# Patient Record
Sex: Male | Born: 1965 | Race: White | Hispanic: No | Marital: Married | Smoking: Current every day smoker
Health system: Southern US, Community
[De-identification: ages and names within clinical notes are randomized; demographics above are authoritative.]

## PROBLEM LIST (undated history)

## (undated) DIAGNOSIS — M1990 Unspecified osteoarthritis, unspecified site: Secondary | ICD-10-CM

## (undated) DIAGNOSIS — F419 Anxiety disorder, unspecified: Secondary | ICD-10-CM

## (undated) DIAGNOSIS — G5600 Carpal tunnel syndrome, unspecified upper limb: Secondary | ICD-10-CM

## (undated) DIAGNOSIS — G4700 Insomnia, unspecified: Secondary | ICD-10-CM

## (undated) DIAGNOSIS — S0292XA Unspecified fracture of facial bones, initial encounter for closed fracture: Secondary | ICD-10-CM

## (undated) HISTORY — PX: OTHER SURGICAL HISTORY: SHX169

## (undated) HISTORY — DX: Anxiety disorder, unspecified: F41.9

## (undated) HISTORY — DX: Unspecified osteoarthritis, unspecified site: M19.90

## (undated) HISTORY — DX: Unspecified fracture of facial bones, initial encounter for closed fracture: S02.92XA

## (undated) HISTORY — DX: Insomnia, unspecified: G47.00

## (undated) HISTORY — DX: Carpal tunnel syndrome, unspecified upper limb: G56.00

---

## 2006-04-03 ENCOUNTER — Emergency Department (HOSPITAL_COMMUNITY): Admission: EM | Admit: 2006-04-03 | Discharge: 2006-04-03 | Payer: Self-pay | Admitting: Emergency Medicine

## 2006-05-13 ENCOUNTER — Emergency Department (HOSPITAL_COMMUNITY): Admission: EM | Admit: 2006-05-13 | Discharge: 2006-05-13 | Payer: Self-pay | Admitting: Emergency Medicine

## 2010-04-12 ENCOUNTER — Encounter: Payer: Self-pay | Admitting: Family Medicine

## 2011-12-07 DIAGNOSIS — J45901 Unspecified asthma with (acute) exacerbation: Secondary | ICD-10-CM | POA: Insufficient documentation

## 2012-02-09 ENCOUNTER — Encounter: Payer: Self-pay | Admitting: *Deleted

## 2012-02-09 ENCOUNTER — Ambulatory Visit (INDEPENDENT_AMBULATORY_CARE_PROVIDER_SITE_OTHER): Payer: Commercial Indemnity | Admitting: Cardiology

## 2012-02-09 ENCOUNTER — Other Ambulatory Visit: Payer: Self-pay | Admitting: Cardiology

## 2012-02-09 ENCOUNTER — Telehealth: Payer: Self-pay | Admitting: Cardiology

## 2012-02-09 ENCOUNTER — Encounter: Payer: Self-pay | Admitting: Cardiology

## 2012-02-09 VITALS — BP 128/83 | HR 88 | Ht 67.0 in | Wt 141.0 lb

## 2012-02-09 DIAGNOSIS — Z720 Tobacco use: Secondary | ICD-10-CM | POA: Insufficient documentation

## 2012-02-09 DIAGNOSIS — M7700 Medial epicondylitis, unspecified elbow: Secondary | ICD-10-CM | POA: Insufficient documentation

## 2012-02-09 DIAGNOSIS — F411 Generalized anxiety disorder: Secondary | ICD-10-CM | POA: Insufficient documentation

## 2012-02-09 DIAGNOSIS — G4700 Insomnia, unspecified: Secondary | ICD-10-CM | POA: Insufficient documentation

## 2012-02-09 DIAGNOSIS — R072 Precordial pain: Secondary | ICD-10-CM | POA: Insufficient documentation

## 2012-02-09 DIAGNOSIS — F17200 Nicotine dependence, unspecified, uncomplicated: Secondary | ICD-10-CM

## 2012-02-09 NOTE — Telephone Encounter (Signed)
stress echocardiogram  Scheduled for 02-14-1012 [facility]
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
## 2012-02-09 NOTE — Assessment & Plan Note (Signed)
We discussed smoking cessation today. He is trying to cut back at the encouragement of his wife.

## 2012-02-09 NOTE — Telephone Encounter (Signed)
Pt has [employer].  No precert required for stress echo.

## 2012-02-09 NOTE — Assessment & Plan Note (Signed)
Intermittent, long-standing as outlined, largely atypical in description. His resting ECG shows no acute ST segment abnormalities. Also having recent trouble with anxiety and insomnia. Main cardiac risk factors include gender and long-standing tobacco use. Plan is to evaluate with an exercise echocardiogram for ischemic assessment. We will inform him of the results.

## 2012-02-09 NOTE — Patient Instructions (Addendum)
Your physician has requested that you have a stress echocardiogram. For further information please visit www.[email or web address]. Please follow instruction sheet as given. Your physician recommends that you continue on your current medications as directed. Please refer to the Current Medication list given to you today. We will call you with your results. 

## 2012-02-14 DIAGNOSIS — R079 Chest pain, unspecified: Secondary | ICD-10-CM

## 2012-02-15 ENCOUNTER — Telehealth: Payer: Self-pay | Admitting: *Deleted

## 2012-02-15 NOTE — Telephone Encounter (Signed)
Patient informed via voicemail.

## 2012-02-15 NOTE — Telephone Encounter (Signed)
Message copied by Eustace Moore on Tue Feb 15, 2012  1:18 PM ------      Message from: Jonelle Sidle      Created: Tue Feb 15, 2012  9:14 AM       Reviewed. Please let him know that study did not indicate any clear evidence of obstructive CAD. Overall reassuring results. Continue to work on smoking cessation, management of anxiety and insomnia per Dr. Neita Carp. We can certainly see him back if symptoms worsen.

## 2012-04-03 DIAGNOSIS — R300 Dysuria: Secondary | ICD-10-CM | POA: Insufficient documentation

## 2012-10-16 DIAGNOSIS — L049 Acute lymphadenitis, unspecified: Secondary | ICD-10-CM | POA: Insufficient documentation

## 2012-12-12 DIAGNOSIS — R197 Diarrhea, unspecified: Secondary | ICD-10-CM | POA: Insufficient documentation

## 2013-01-16 DIAGNOSIS — R059 Cough, unspecified: Secondary | ICD-10-CM | POA: Insufficient documentation

## 2013-01-16 DIAGNOSIS — J019 Acute sinusitis, unspecified: Secondary | ICD-10-CM | POA: Insufficient documentation

## 2013-01-16 DIAGNOSIS — R05 Cough: Secondary | ICD-10-CM | POA: Insufficient documentation

## 2013-04-19 DIAGNOSIS — M2550 Pain in unspecified joint: Secondary | ICD-10-CM | POA: Insufficient documentation

## 2014-08-06 DIAGNOSIS — M6530 Trigger finger, unspecified finger: Secondary | ICD-10-CM | POA: Insufficient documentation

## 2014-08-13 DIAGNOSIS — G5603 Carpal tunnel syndrome, bilateral upper limbs: Secondary | ICD-10-CM | POA: Insufficient documentation

## 2015-03-12 ENCOUNTER — Encounter: Payer: Self-pay | Admitting: *Deleted

## 2015-03-18 ENCOUNTER — Encounter: Payer: Self-pay | Admitting: Diagnostic Neuroimaging

## 2015-03-18 ENCOUNTER — Ambulatory Visit (INDEPENDENT_AMBULATORY_CARE_PROVIDER_SITE_OTHER): Payer: 59 | Admitting: Diagnostic Neuroimaging

## 2015-03-18 VITALS — BP 113/74 | HR 63 | Ht 70.5 in | Wt 130.8 lb

## 2015-03-18 DIAGNOSIS — R202 Paresthesia of skin: Secondary | ICD-10-CM | POA: Diagnosis not present

## 2015-03-18 DIAGNOSIS — M62541 Muscle wasting and atrophy, not elsewhere classified, right hand: Secondary | ICD-10-CM | POA: Diagnosis not present

## 2015-03-18 DIAGNOSIS — M79641 Pain in right hand: Secondary | ICD-10-CM

## 2015-03-18 DIAGNOSIS — M542 Cervicalgia: Secondary | ICD-10-CM

## 2015-03-18 DIAGNOSIS — R200 Anesthesia of skin: Secondary | ICD-10-CM
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
## 2015-03-18 NOTE — Patient Instructions (Signed)
Thank you for coming to see [place] at [facility]. I hope we have been able to provide you high quality care today.  You may receive a patient satisfaction survey over the next few weeks. We would appreciate your feedback and comments so that we may continue to improve ourselves and the health of our patients.  - consider rheumatology evaluation - avoid pressure on right elbow/wrist - may consider MRI cervical spine and EMG (nerve test) in future   ~~~~~~~~~~~~~~~~~~~~~~~~~~~~~~~~~~~~~~~~~~~~~~~~~~~~~~~~~~~~~~~~~  DR. PENUMALLI'S GUIDE TO HAPPY AND HEALTHY LIVING These are some of my general health and wellness recommendations. Some of them may apply to you better than others. Please use common sense as you try these suggestions and feel free to ask me any questions.   ACTIVITY/FITNESS Mental, social, emotional and physical stimulation are very important for brain and body health. Try learning a new activity (arts, music, language, sports, games).  Keep moving your body to the best of your abilities. You can do this at home, inside or outside, the park, community center, gym or anywhere you like. Consider a physical therapist or personal trainer to get started. Consider the app Sworkit. Fitness trackers such as smart-watches, smart-phones or Fitbits can help as well.   NUTRITION Eat more plants: colorful vegetables, nuts, seeds and berries.  Eat less sugar, salt, preservatives and processed foods.  Avoid toxins such as cigarettes and alcohol.  Drink water when you are thirsty. Warm water with a slice of lemon is an excellent morning drink to start the day.  Consider these websites for more information The Nutrition Source (https://www.henry-hernandez.biz/) Precision Nutrition (WindowBlog.ch)   RELAXATION Consider practicing mindfulness meditation or other relaxation techniques such as deep breathing, prayer, yoga, tai chi,  massage. See website mindful.org or the apps Headspace or Calm to help get started.   SLEEP Try to get at least 7-8+ hours sleep per day. Regular exercise and reduced caffeine will help you sleep better. Practice good sleep hygeine techniques. See website sleep.org for more information.   PLANNING Prepare estate planning, living will, healthcare POA documents. Sometimes this is best planned with the help of an attorney. Theconversationproject.org and agingwithdignity.org are excellent resources.

## 2015-06-20 ENCOUNTER — Ambulatory Visit (INDEPENDENT_AMBULATORY_CARE_PROVIDER_SITE_OTHER): Payer: 59 | Admitting: Diagnostic Neuroimaging

## 2015-06-20 ENCOUNTER — Encounter: Payer: Self-pay | Admitting: Diagnostic Neuroimaging

## 2015-06-20 VITALS — BP 106/69 | HR 71 | Wt 126.0 lb

## 2015-06-20 DIAGNOSIS — M542 Cervicalgia: Secondary | ICD-10-CM

## 2015-06-20 DIAGNOSIS — M62541 Muscle wasting and atrophy, not elsewhere classified, right hand: Secondary | ICD-10-CM

## 2015-06-20 DIAGNOSIS — M79641 Pain in right hand: Secondary | ICD-10-CM

## 2015-06-20 DIAGNOSIS — R202 Paresthesia of skin: Secondary | ICD-10-CM

## 2015-06-20 DIAGNOSIS — R200 Anesthesia of skin: Secondary | ICD-10-CM
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
## 2015-08-08 DIAGNOSIS — J209 Acute bronchitis, unspecified: Secondary | ICD-10-CM | POA: Insufficient documentation

## 2015-09-17 ENCOUNTER — Other Ambulatory Visit (HOSPITAL_COMMUNITY): Payer: Self-pay | Admitting: Internal Medicine

## 2015-09-17 DIAGNOSIS — B182 Chronic viral hepatitis C: Secondary | ICD-10-CM

## 2015-09-25 ENCOUNTER — Ambulatory Visit (HOSPITAL_COMMUNITY): Payer: 59

## 2015-10-02 ENCOUNTER — Ambulatory Visit (HOSPITAL_COMMUNITY)
Admission: RE | Admit: 2015-10-02 | Discharge: 2015-10-02 | Disposition: A | Discharge status: Home / Self Care | Payer: Managed Care, Other (non HMO) | Source: Ambulatory Visit | Attending: Internal Medicine | Admitting: Internal Medicine

## 2015-10-02 DIAGNOSIS — K824 Cholesterolosis of gallbladder: Secondary | ICD-10-CM | POA: Diagnosis not present

## 2015-10-02 DIAGNOSIS — B182 Chronic viral hepatitis C: Secondary | ICD-10-CM

## 2015-11-12 DIAGNOSIS — B182 Chronic viral hepatitis C: Secondary | ICD-10-CM | POA: Insufficient documentation

## 2015-12-24 ENCOUNTER — Ambulatory Visit: Payer: 59 | Admitting: Diagnostic Neuroimaging

## 2016-05-26 DIAGNOSIS — Z681 Body mass index (BMI) 19 or less, adult: Secondary | ICD-10-CM | POA: Insufficient documentation

## 2016-11-17 ENCOUNTER — Encounter (INDEPENDENT_AMBULATORY_CARE_PROVIDER_SITE_OTHER): Payer: Self-pay

## 2016-11-17 ENCOUNTER — Encounter (INDEPENDENT_AMBULATORY_CARE_PROVIDER_SITE_OTHER): Payer: Self-pay | Admitting: Internal Medicine

## 2016-12-21 ENCOUNTER — Ambulatory Visit (INDEPENDENT_AMBULATORY_CARE_PROVIDER_SITE_OTHER): Payer: 59 | Admitting: Internal Medicine

## 2017-01-20 ENCOUNTER — Ambulatory Visit (INDEPENDENT_AMBULATORY_CARE_PROVIDER_SITE_OTHER): Payer: 59 | Admitting: Internal Medicine

## 2017-01-24 NOTE — Progress Notes (Signed)
err

## 2017-01-27 ENCOUNTER — Encounter (INDEPENDENT_AMBULATORY_CARE_PROVIDER_SITE_OTHER): Payer: Self-pay | Admitting: Internal Medicine

## 2017-09-06 ENCOUNTER — Other Ambulatory Visit (HOSPITAL_COMMUNITY): Payer: Self-pay | Admitting: Gastroenterology

## 2017-09-06 DIAGNOSIS — B1920 Unspecified viral hepatitis C without hepatic coma: Secondary | ICD-10-CM

## 2017-09-13 ENCOUNTER — Ambulatory Visit (HOSPITAL_COMMUNITY)
Admission: RE | Admit: 2017-09-13 | Discharge: 2017-09-13 | Disposition: A | Discharge status: Home / Self Care | Payer: Managed Care, Other (non HMO) | Source: Ambulatory Visit | Attending: Gastroenterology | Admitting: Gastroenterology

## 2017-09-13 DIAGNOSIS — B1920 Unspecified viral hepatitis C without hepatic coma: Secondary | ICD-10-CM | POA: Insufficient documentation

## 2017-09-15 ENCOUNTER — Other Ambulatory Visit (HOSPITAL_COMMUNITY): Payer: Self-pay | Admitting: Pulmonary Disease

## 2017-09-15 DIAGNOSIS — R918 Other nonspecific abnormal finding of lung field: Secondary | ICD-10-CM

## 2017-09-26 ENCOUNTER — Ambulatory Visit (HOSPITAL_COMMUNITY)
Admission: RE | Admit: 2017-09-26 | Discharge: 2017-09-26 | Disposition: A | Discharge status: Home / Self Care | Payer: Managed Care, Other (non HMO) | Source: Ambulatory Visit | Attending: Pulmonary Disease | Admitting: Pulmonary Disease

## 2017-09-26 DIAGNOSIS — R918 Other nonspecific abnormal finding of lung field: Secondary | ICD-10-CM

## 2017-09-26 DIAGNOSIS — R911 Solitary pulmonary nodule: Secondary | ICD-10-CM | POA: Diagnosis not present

## 2017-09-26 DIAGNOSIS — M899 Disorder of bone, unspecified: Secondary | ICD-10-CM | POA: Insufficient documentation

## 2017-09-26 MED ORDER — FLUDEOXYGLUCOSE F - 18 (FDG) INJECTION
8.5600 | Freq: Once | INTRAVENOUS | Status: AC | PRN
  Administered 2017-09-26: 8.56 via INTRAVENOUS

## 2017-10-03 ENCOUNTER — Telehealth: Payer: Self-pay | Admitting: *Deleted

## 2017-10-03 NOTE — Telephone Encounter (Signed)
Oncology Nurse Navigator Documentation  Oncology Nurse Navigator Flowsheets 10/03/2017  Navigator Location [facility]  Referral date to RadOnc/MedOnc 10/03/2017  Navigator Encounter Type Telephone/I received referral from Dr. Kathaleen Grinder office today.  I called patient to schedule for [place] this week. I was unable to reach him but did leave a vm message with my name and phone number to call.   Telephone Outgoing Call  Treatment Phase Abnormal Scans  Barriers/Navigation Needs Coordination of Care  Interventions Coordination of Care  Coordination of Care Other  Acuity Level 2  Time Spent with Patient 30

## 2017-10-04 ENCOUNTER — Telehealth: Payer: Self-pay | Admitting: *Deleted

## 2017-10-04 NOTE — Telephone Encounter (Signed)
Oncology Nurse Navigator Documentation  Oncology Nurse Navigator Flowsheets 10/04/2017  Navigator Location [facility]  Navigator Encounter Type Telephone/I called Zachary Mejia today but was unable to reach him. I did leave vm message for him to call me with my name and phone number.   Telephone Outgoing Call  Treatment Phase Abnormal Scans  Barriers/Navigation Needs Coordination of Care  Interventions Coordination of Care  Coordination of Care Other  Acuity Level 1  Time Spent with Patient 15

## 2017-10-24 ENCOUNTER — Encounter: Payer: Self-pay | Admitting: *Deleted

## 2017-10-24 NOTE — Progress Notes (Signed)
Oncology Nurse Navigator Documentation  Oncology Nurse Navigator Flowsheets 10/24/2017  Navigator Location [facility]  Navigator Encounter Type Telephone/Patient called me and left a message to call his wife with an appt time.  I called and spoke with her. I gave her the appt for [place] on 815/19.  He verbalized understanding of appt time and place.   Telephone Incoming Call;Outgoing Call  Treatment Phase Abnormal Scans  Barriers/Navigation Needs Education;Coordination of Care  Education Other  Interventions Coordination of Care;Education  Coordination of Care Appts  Education Method Verbal  Acuity Level 2  Time Spent with Patient 15

## 2017-11-01 ENCOUNTER — Encounter: Payer: Self-pay | Admitting: Internal Medicine

## 2017-11-01 NOTE — Progress Notes (Signed)
Called Dr. [name] office at [phone] to request patient's medical records for  upcoming thoracic conference/clinic, left voicemail with Marletta Lor, also sent a faxed request to the office at [phone], still waiting for records.

## 2017-11-02 ENCOUNTER — Telehealth: Payer: Self-pay | Admitting: Internal Medicine

## 2017-11-02 ENCOUNTER — Other Ambulatory Visit: Payer: Self-pay | Admitting: *Deleted

## 2017-11-02 ENCOUNTER — Encounter: Payer: Self-pay | Admitting: *Deleted

## 2017-11-02 DIAGNOSIS — R072 Precordial pain: Secondary | ICD-10-CM

## 2017-11-02 NOTE — Progress Notes (Signed)
Oncology Nurse Navigator Documentation  Oncology Nurse Navigator Flowsheets 11/02/2017  Navigator Location [facility]  Navigator Encounter Type Other/I called Dr. Kathaleen Grinder office to obtain records. He was seen only once by this office.   Treatment Phase Abnormal Scans  Barriers/Navigation Needs Coordination of Care  Interventions Coordination of Care  Coordination of Care Other  Acuity Level 2  Time Spent with Patient 30

## 2017-11-02 NOTE — Telephone Encounter (Signed)
Spoke with patients wife [name] to confirm 8/15 [place] appointment, she confirmed, I also asked her to bring CD of the CT Chest with her tomorrow as well she said she would do her best to at least get the report if nothing else

## 2017-11-03 ENCOUNTER — Inpatient Hospital Stay: Payer: Managed Care, Other (non HMO) | Attending: Internal Medicine

## 2017-11-03 ENCOUNTER — Encounter: Payer: Self-pay | Admitting: *Deleted

## 2017-11-03 ENCOUNTER — Ambulatory Visit (INDEPENDENT_AMBULATORY_CARE_PROVIDER_SITE_OTHER): Payer: Managed Care, Other (non HMO) | Admitting: Thoracic Surgery (Cardiothoracic Vascular Surgery)

## 2017-11-03 ENCOUNTER — Encounter: Payer: Self-pay | Admitting: Internal Medicine

## 2017-11-03 ENCOUNTER — Inpatient Hospital Stay (HOSPITAL_BASED_OUTPATIENT_CLINIC_OR_DEPARTMENT_OTHER): Payer: Managed Care, Other (non HMO) | Admitting: Internal Medicine

## 2017-11-03 ENCOUNTER — Ambulatory Visit
Admission: RE | Admit: 2017-11-03 | Discharge: 2017-11-03 | Disposition: A | Discharge status: Home / Self Care | Payer: Managed Care, Other (non HMO) | Source: Ambulatory Visit | Attending: Radiation Oncology | Admitting: Radiation Oncology

## 2017-11-03 ENCOUNTER — Other Ambulatory Visit: Payer: Self-pay | Admitting: Medical Oncology

## 2017-11-03 VITALS — BP 108/70 | HR 73 | Temp 97.8°F | Resp 18 | Ht 70.5 in | Wt 132.2 lb

## 2017-11-03 DIAGNOSIS — M129 Arthropathy, unspecified: Secondary | ICD-10-CM | POA: Diagnosis not present

## 2017-11-03 DIAGNOSIS — Z79899 Other long term (current) drug therapy: Secondary | ICD-10-CM | POA: Diagnosis not present

## 2017-11-03 DIAGNOSIS — F419 Anxiety disorder, unspecified: Secondary | ICD-10-CM | POA: Diagnosis not present

## 2017-11-03 DIAGNOSIS — Z720 Tobacco use: Secondary | ICD-10-CM

## 2017-11-03 DIAGNOSIS — R918 Other nonspecific abnormal finding of lung field: Secondary | ICD-10-CM

## 2017-11-03 DIAGNOSIS — F17210 Nicotine dependence, cigarettes, uncomplicated: Secondary | ICD-10-CM

## 2017-11-03 DIAGNOSIS — R072 Precordial pain: Secondary | ICD-10-CM

## 2017-11-03 DIAGNOSIS — R05 Cough: Secondary | ICD-10-CM | POA: Diagnosis not present

## 2017-11-03 DIAGNOSIS — R911 Solitary pulmonary nodule: Secondary | ICD-10-CM | POA: Diagnosis not present

## 2017-11-03 LAB — CBC WITH DIFFERENTIAL (CANCER CENTER ONLY)
Basophils Absolute: 0 10*3/uL (ref 0.0–0.1)
Basophils Relative: 0 %
EOS PCT: 1 %
Eosinophils Absolute: 0.1 10*3/uL (ref 0.0–0.5)
HCT: 40.7 % (ref 38.4–49.9)
Hemoglobin: 13.3 g/dL (ref 13.0–17.1)
LYMPHS ABS: 1.9 10*3/uL (ref 0.9–3.3)
LYMPHS PCT: 25 %
MCH: 28.9 pg (ref 27.2–33.4)
MCHC: 32.7 g/dL (ref 32.0–36.0)
MCV: 88.5 fL (ref 79.3–98.0)
MONO ABS: 0.6 10*3/uL (ref 0.1–0.9)
Monocytes Relative: 7 %
Neutro Abs: 5 10*3/uL (ref 1.5–6.5)
Neutrophils Relative %: 67 %
Platelet Count: 163 10*3/uL (ref 140–400)
RBC: 4.6 MIL/uL (ref 4.20–5.82)
RDW: 14.8 % — AB (ref 11.0–14.6)
WBC Count: 7.6 10*3/uL (ref 4.0–10.3)

## 2017-11-03 LAB — CMP (CANCER CENTER ONLY)
ALBUMIN: 3.9 g/dL (ref 3.5–5.0)
ALK PHOS: 101 U/L (ref 38–126)
ALT: 63 U/L — AB (ref 0–44)
AST: 41 U/L (ref 15–41)
Anion gap: 7 (ref 5–15)
BILIRUBIN TOTAL: 0.4 mg/dL (ref 0.3–1.2)
BUN: 13 mg/dL (ref 6–20)
CO2: 30 mmol/L (ref 22–32)
CREATININE: 1.05 mg/dL (ref 0.61–1.24)
Calcium: 9.2 mg/dL (ref 8.9–10.3)
Chloride: 103 mmol/L (ref 98–111)
GFR, Est AFR Am: >60 mL/min (ref >60)
GFR, Estimated: >60 mL/min (ref >60)
GLUCOSE: 83 mg/dL (ref 70–99)
Potassium: 4.4 mmol/L (ref 3.5–5.1)
Sodium: 140 mmol/L (ref 135–145)
TOTAL PROTEIN: 7.1 g/dL (ref 6.5–8.1)
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
## 2017-11-03 NOTE — Progress Notes (Signed)
[place] Clinical Social Work  Clinical Social Work met with patient/family at [employer] appointment to offer support and assess for psychosocial needs. Mr. Knueppel shared he has no concerns and is very relieved to learn he does not have cancer.      Clinical Social Work briefly discussed Clinical Social Work role and [employer] support programs/services.  Clinical Social Work encouraged patient to call with any additional questions or concerns.   Maryjean Morn, MSW, LCSW, OSW-C Clinical Social Worker [facility] [phone]

## 2017-11-03 NOTE — Progress Notes (Signed)
Oncology Nurse Navigator Documentation  Oncology Nurse Navigator Flowsheets 11/03/2017  Navigator Location [facility]  Navigator Encounter Type Clinic/MDC/I spoke with patient at thoracic clinic today.  I help to explain next steps. He will have a follow up with Dr. Roxan Hockey with a scan and patient verbalized understanding.   Abnormal Finding Date 08/23/2017  Multidisiplinary Clinic Date 11/03/2017  Patient Visit Type MedOnc  Treatment Phase Abnormal Scans  Barriers/Navigation Needs Education  Education Other  Interventions Education  Education Method Verbal  Acuity Level 2  Time Spent with Patient 30
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
## 2017-11-03 NOTE — Progress Notes (Signed)
Spoke with Kim @ [facility] in [place] [phone]) to get a copy of patients CT Chest on a CD, Maudie Mercury advised she will have to mail it to me she can not push images over

## 2017-11-04 ENCOUNTER — Telehealth: Payer: Self-pay | Admitting: Internal Medicine

## 2017-11-04 NOTE — Telephone Encounter (Signed)
Per 8/15 los f/u as needed. No other orders.

## 2017-11-09 ENCOUNTER — Ambulatory Visit
Admission: RE | Admit: 2017-11-09 | Discharge: 2017-11-09 | Disposition: A | Discharge status: Home / Self Care | Payer: Self-pay | Source: Ambulatory Visit | Attending: Radiation Oncology | Admitting: Radiation Oncology

## 2017-11-09 ENCOUNTER — Other Ambulatory Visit: Payer: Self-pay | Admitting: Radiation Oncology

## 2017-11-09 DIAGNOSIS — C3490 Malignant neoplasm of unspecified part of unspecified bronchus or lung: Secondary | ICD-10-CM

## 2017-11-25 ENCOUNTER — Telehealth: Payer: Self-pay | Admitting: *Deleted

## 2017-11-25 NOTE — Telephone Encounter (Signed)
Oncology Nurse Navigator Documentation  Oncology Nurse Navigator Flowsheets 11/25/2017  Navigator Location [facility]  Navigator Encounter Type Telephone/I called to remind about his appt time and location with Dr. Roxan Hockey on 11/28/17 and to check on his smoking cessation.  I was unable to reach him but did speak to his wife.  I updated her on time and location for appt with Dr. Roxan Hockey.  I asked how his smoking cessation is going.  I listened as she explained he has cut back but not quit.  I told her he can call me and we can go over tips to quit. She was thankful for the call and update.   Telephone Outgoing Call  Abnormal Finding Date 08/23/2017  Multidisiplinary Clinic Date 11/03/2017  Barriers/Navigation Needs Education  Education Smoking cessation;Other  Interventions Education  Education Method Verbal  Acuity Level 1  Time Spent with Patient 30

## 2017-11-28 ENCOUNTER — Ambulatory Visit: Payer: Self-pay | Admitting: Thoracic Surgery (Cardiothoracic Vascular Surgery)

## 2017-11-28 ENCOUNTER — Other Ambulatory Visit: Payer: Self-pay | Admitting: *Deleted

## 2017-11-28 VITALS — BP 113/72 | HR 76 | Resp 20 | Ht 70.5 in | Wt 131.0 lb

## 2017-11-28 DIAGNOSIS — R911 Solitary pulmonary nodule: Secondary | ICD-10-CM

## 2017-11-28 NOTE — Progress Notes (Signed)
Zachary Mejia returns today for follow-up of a right middle lobe lung abnormality seen on CT back in June and on PET/CT in July.  He was supposed of had a CT of the chest done.  Unfortunately, that was not done and we have no new scan to review.  We will schedule CT chest.  [facility] be done at [facility].  I will see him back next week to discuss results.

## 2017-11-29 ENCOUNTER — Other Ambulatory Visit: Payer: Self-pay | Admitting: Thoracic Surgery (Cardiothoracic Vascular Surgery)

## 2017-12-07 ENCOUNTER — Encounter: Payer: Self-pay | Admitting: Thoracic Surgery (Cardiothoracic Vascular Surgery)

## 2017-12-07 ENCOUNTER — Ambulatory Visit (INDEPENDENT_AMBULATORY_CARE_PROVIDER_SITE_OTHER): Payer: Managed Care, Other (non HMO) | Admitting: Thoracic Surgery (Cardiothoracic Vascular Surgery)

## 2017-12-07 ENCOUNTER — Ambulatory Visit
Admission: RE | Admit: 2017-12-07 | Discharge: 2017-12-07 | Disposition: A | Discharge status: Home / Self Care | Payer: Managed Care, Other (non HMO) | Source: Ambulatory Visit | Attending: Thoracic Surgery (Cardiothoracic Vascular Surgery) | Admitting: Thoracic Surgery (Cardiothoracic Vascular Surgery)

## 2017-12-07 VITALS — BP 103/71 | HR 80 | Resp 20 | Ht 70.5 in | Wt 131.0 lb

## 2017-12-07 DIAGNOSIS — R911 Solitary pulmonary nodule: Secondary | ICD-10-CM
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
## 2018-02-01 ENCOUNTER — Other Ambulatory Visit: Payer: Self-pay | Admitting: *Deleted

## 2018-02-01 DIAGNOSIS — R911 Solitary pulmonary nodule: Secondary | ICD-10-CM

## 2018-02-01 NOTE — Progress Notes (Unsigned)
ct 

## 2018-03-07 ENCOUNTER — Other Ambulatory Visit: Payer: Managed Care, Other (non HMO)

## 2018-03-07 ENCOUNTER — Ambulatory Visit: Payer: Managed Care, Other (non HMO) | Admitting: Thoracic Surgery (Cardiothoracic Vascular Surgery)

## 2018-03-17 ENCOUNTER — Ambulatory Visit
Admission: RE | Admit: 2018-03-17 | Discharge: 2018-03-17 | Disposition: A | Discharge status: Home / Self Care | Payer: Managed Care, Other (non HMO) | Source: Ambulatory Visit | Attending: Thoracic Surgery (Cardiothoracic Vascular Surgery) | Admitting: Thoracic Surgery (Cardiothoracic Vascular Surgery)

## 2018-03-17 DIAGNOSIS — R911 Solitary pulmonary nodule: Secondary | ICD-10-CM

## 2018-04-03 ENCOUNTER — Other Ambulatory Visit: Payer: Managed Care, Other (non HMO)

## 2018-04-04 ENCOUNTER — Ambulatory Visit: Payer: Managed Care, Other (non HMO) | Admitting: Thoracic Surgery (Cardiothoracic Vascular Surgery)

## 2018-04-17 ENCOUNTER — Ambulatory Visit (HOSPITAL_COMMUNITY)
Admission: RE | Admit: 2018-04-17 | Discharge: 2018-04-17 | Disposition: A | Discharge status: Home / Self Care | Payer: Managed Care, Other (non HMO) | Source: Ambulatory Visit | Attending: Otolaryngology | Admitting: Otolaryngology

## 2018-04-17 ENCOUNTER — Other Ambulatory Visit (HOSPITAL_COMMUNITY): Payer: Self-pay | Admitting: Otolaryngology

## 2018-04-17 ENCOUNTER — Ambulatory Visit (HOSPITAL_COMMUNITY): Payer: Managed Care, Other (non HMO)

## 2018-04-17 DIAGNOSIS — L0201 Cutaneous abscess of face: Secondary | ICD-10-CM | POA: Diagnosis present

## 2018-04-17 DIAGNOSIS — L03211 Cellulitis of face: Secondary | ICD-10-CM | POA: Insufficient documentation

## 2018-04-17 MED ORDER — IOHEXOL 300 MG/ML  SOLN
75.0000 mL | Freq: Once | INTRAMUSCULAR | Status: AC | PRN
  Administered 2018-04-17: 75 mL via INTRAVENOUS

## 2018-04-25 ENCOUNTER — Ambulatory Visit: Payer: Managed Care, Other (non HMO) | Admitting: Thoracic Surgery (Cardiothoracic Vascular Surgery)

## 2018-05-01 ENCOUNTER — Ambulatory Visit (INDEPENDENT_AMBULATORY_CARE_PROVIDER_SITE_OTHER): Payer: Managed Care, Other (non HMO) | Admitting: Thoracic Surgery (Cardiothoracic Vascular Surgery)

## 2018-05-01 VITALS — BP 102/69 | HR 70 | Resp 20 | Ht 70.5 in | Wt 139.0 lb

## 2018-05-01 DIAGNOSIS — R911 Solitary pulmonary nodule: Secondary | ICD-10-CM | POA: Diagnosis not present
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
## 2019-01-04 IMAGING — CT CT CHEST W/O CM
1 series · 15 of 34 positions shown, 19 images · non-contrast
Comparison: PET 09/26/2017 and CT chest 08/23/2017.

CLINICAL DATA: Right lung nodule, cough, smoker.

EXAM:
CT CHEST WITHOUT CONTRAST
TECHNIQUE: Multidetector CT imaging of the chest was performed following the
standard protocol without IV contrast.

[Series 2: chest w/(date) · axial · 0.76mm/px · z∈[-337,-7]mm · 15 of 195 slices shown, 19 images]
[im 15/195  mediastinal]
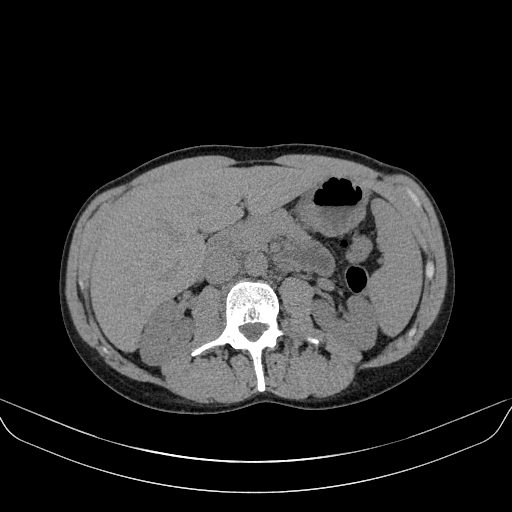
[im 15/195  lung]
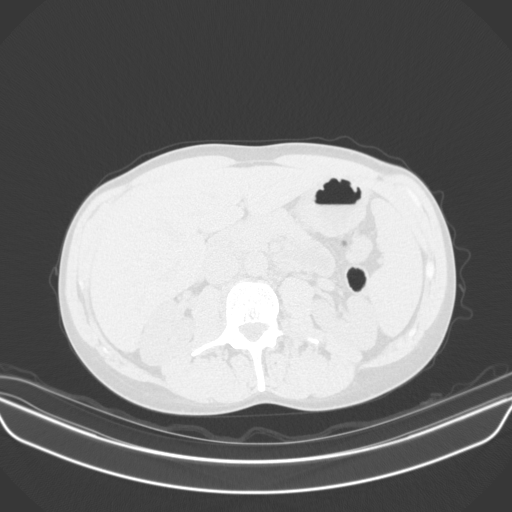
[im 29/195  lung]
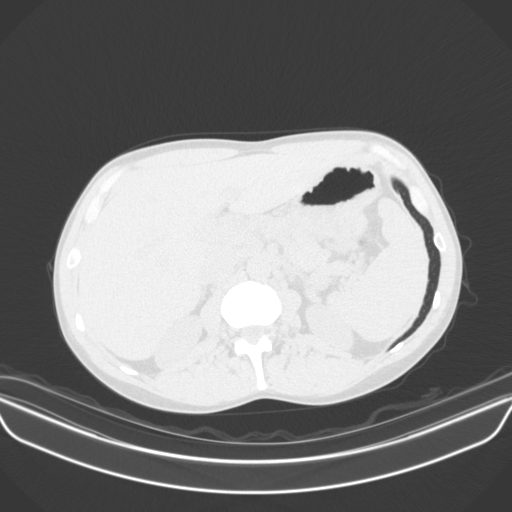
[im 39/195  lung]
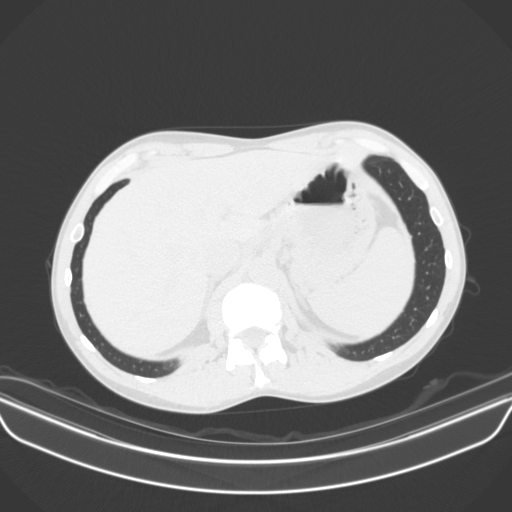
[im 51/195  lung]
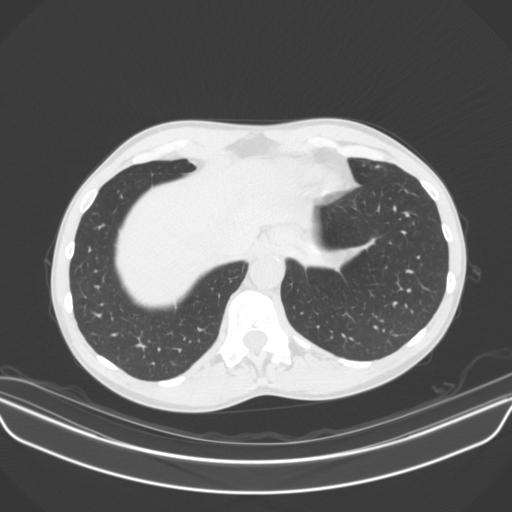
[im 65/195  mediastinal]
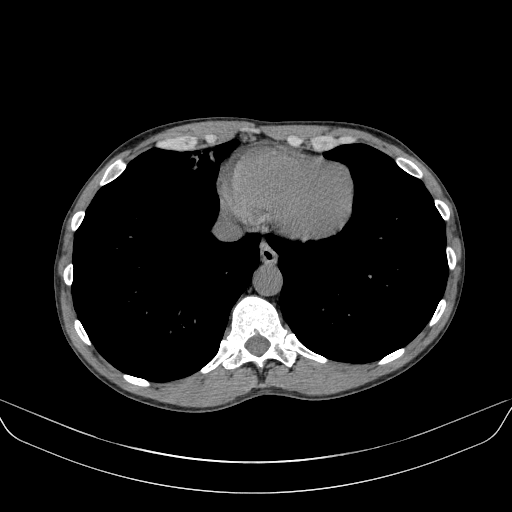
[im 65/195  lung]
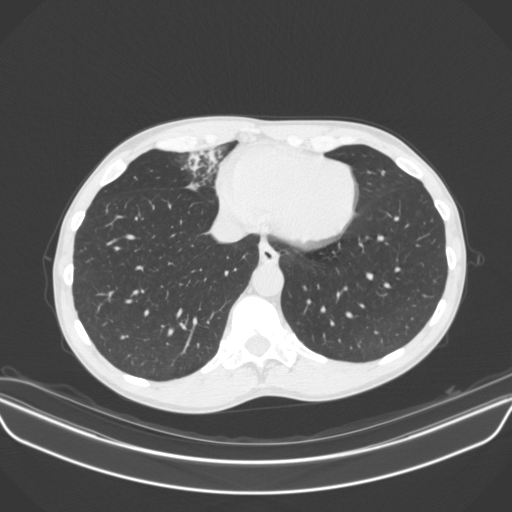
[im 78/195  lung]
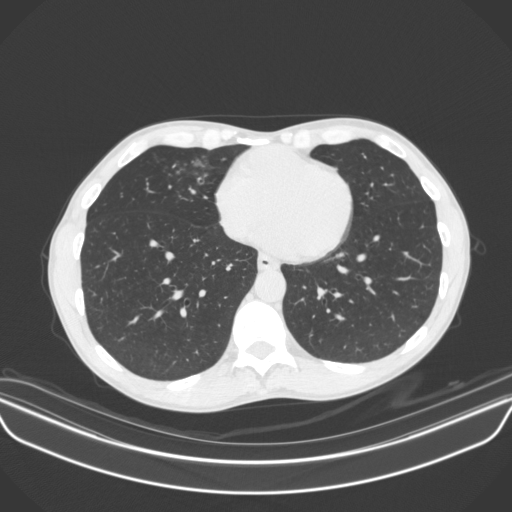
[im 87/195  lung]
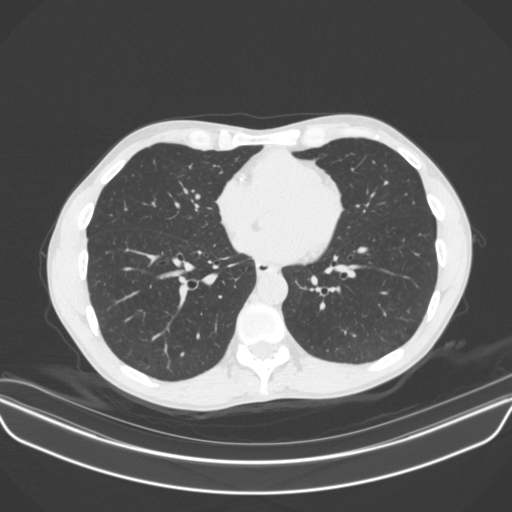
[im 101/195  lung]
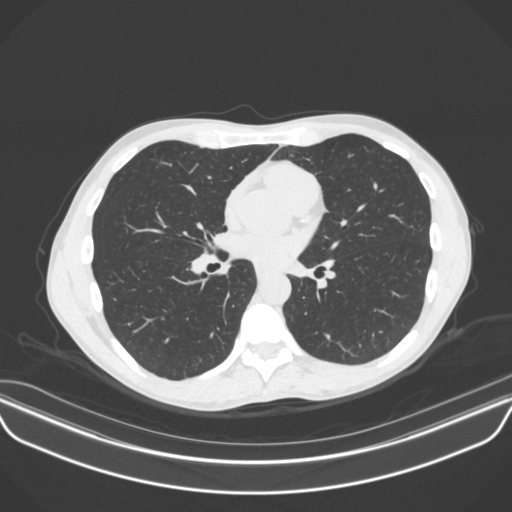
[im 108/195  mediastinal]
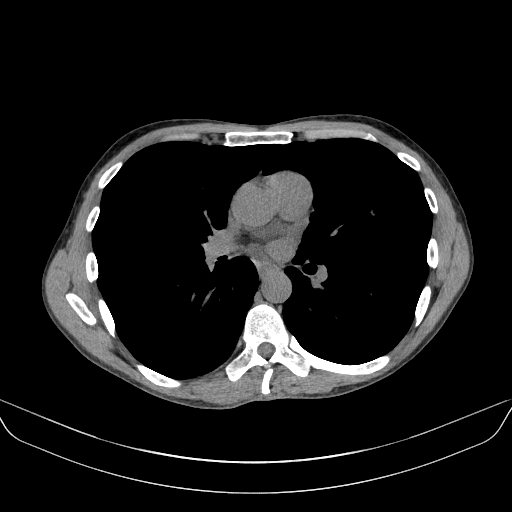
[im 108/195  lung]
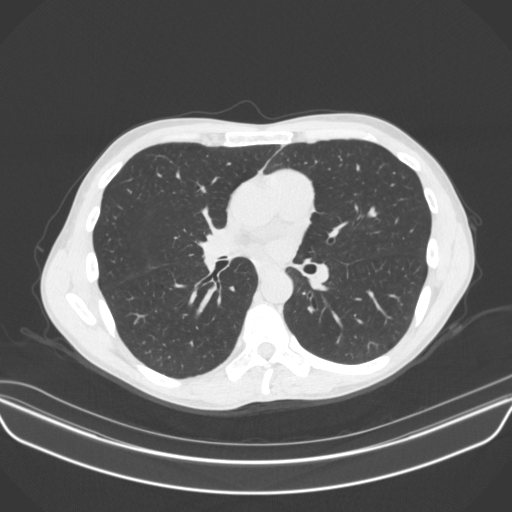
[im 117/195  lung]
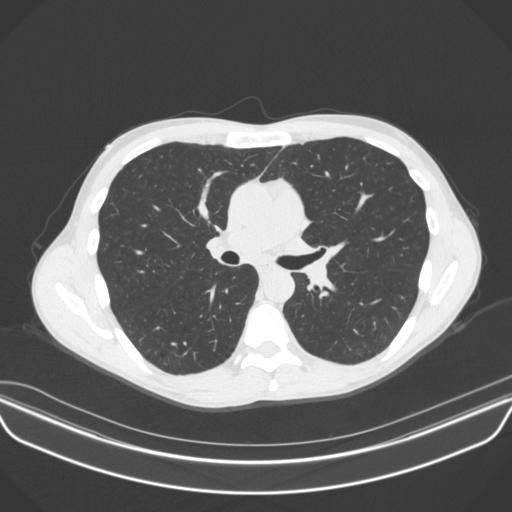
[im 130/195  lung]
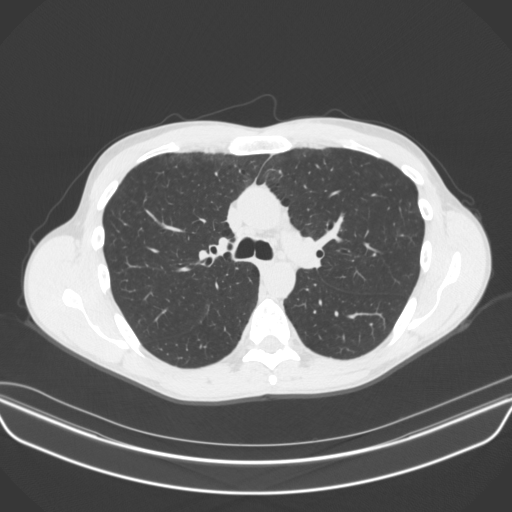
[im 144/195  lung]
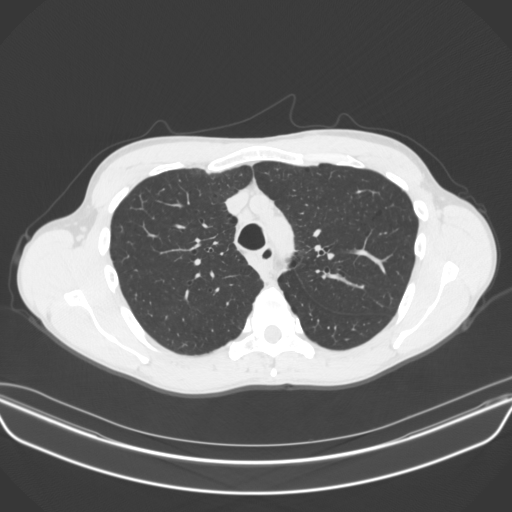
[im 156/195  mediastinal]
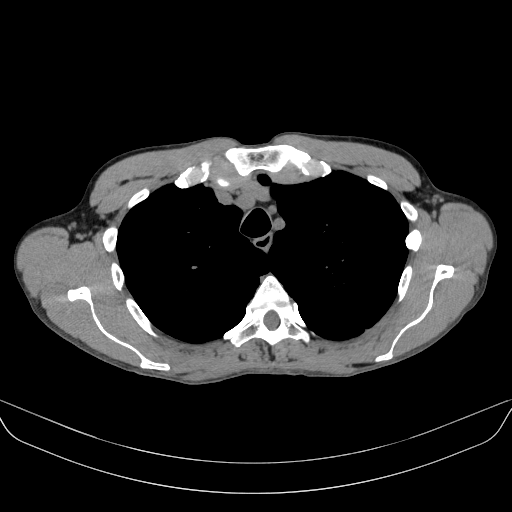
[im 156/195  lung]
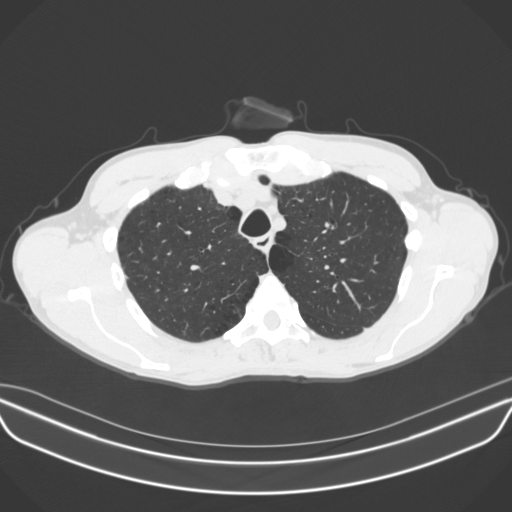
[im 166/195  lung]
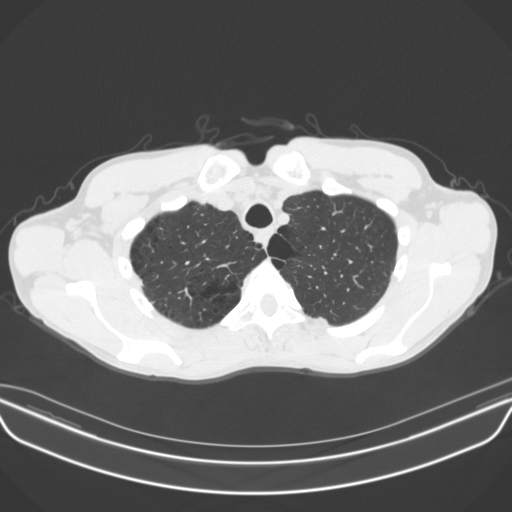
[im 180/195  lung]
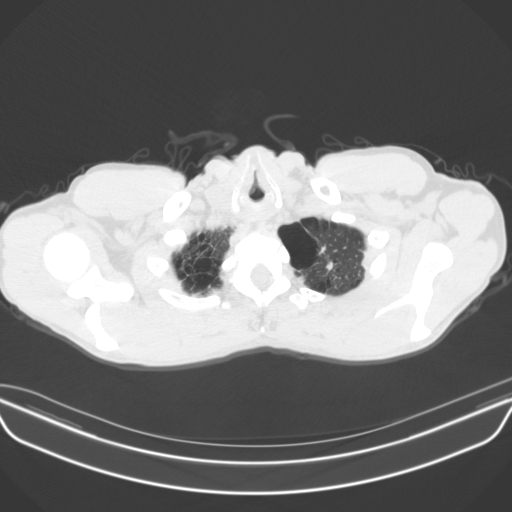

[15 of 34 positions shown; findings below may reference images not displayed]

FINDINGS: Cardiovascular: Coronary artery calcification. Heart size normal. No
pericardial effusion.

Mediastinum/Nodes: Mediastinal lymph nodes measure up to 9 mm in the
right paratracheal station, likely similar. Hilar regions are
difficult to evaluate without IV contrast. No axillary adenopathy.
Esophagus is grossly unremarkable.

Lungs/Pleura: Biapical pleuroparenchymal scarring, left greater than
right. Centrilobular and paraseptal emphysema with bullous changes
the apices. Ill-defined centrilobular nodularity of smoking related
respiratory bronchiolitis. 4 mm anterior segment right upper lobe
nodule (series 3, image 67), unchanged. Somewhat amorphous
peribronchovascular nodularity, bronchiectasis, mucoid impaction and
consolidation in the right middle lobe is again seen. The area of
consolidation appears less nodular than on prior studies. Lungs are
otherwise clear. No pleural fluid. Airway is unremarkable.

Upper Abdomen: Visualized portions of the liver, adrenal glands,
kidneys, spleen, pancreas, stomach and bowel are grossly
unremarkable. Upper abdominal lymph nodes are not enlarged by CT
size criteria.

Musculoskeletal:
IMPRESSION: 1. Somewhat amorphous area of clustered peribronchovascular
nodularity, bronchiectasis, mucoid impaction and consolidation in
the right middle lobe. The area of consolidation appears less
nodular than on the prior exam. An indolent infectious process
and/or post infectious scarring is strongly favored over malignancy.
Additional follow-up CT chest without contrast could be obtained in
3 months, as clinically indicated.
2.  Emphysema ([id number]).

## 2019-04-18 ENCOUNTER — Other Ambulatory Visit: Payer: Self-pay

## 2019-04-18 ENCOUNTER — Encounter (INDEPENDENT_AMBULATORY_CARE_PROVIDER_SITE_OTHER): Payer: Self-pay | Admitting: Gastroenterology

## 2019-04-18 ENCOUNTER — Ambulatory Visit (INDEPENDENT_AMBULATORY_CARE_PROVIDER_SITE_OTHER): Payer: Managed Care, Other (non HMO) | Admitting: Gastroenterology

## 2019-04-18 ENCOUNTER — Encounter (INDEPENDENT_AMBULATORY_CARE_PROVIDER_SITE_OTHER): Payer: Self-pay | Admitting: *Deleted

## 2019-04-18 VITALS — BP 141/88 | HR 80 | Temp 97.4°F | Ht 68.5 in | Wt 136.1 lb

## 2019-04-18 DIAGNOSIS — B1920 Unspecified viral hepatitis C without hepatic coma: Secondary | ICD-10-CM
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
## 2019-04-18 NOTE — Patient Instructions (Signed)
We are checking additional labs today for evaluation and will contact you with results.  We will also need to arrange an ultrasound to determine if there is fibrosis in the liver before treating the virus  Please let me know if you like to schedule colonoscopy.

## 2019-04-21 LAB — CBC WITH DIFFERENTIAL/PLATELET
Absolute Monocytes: 535 cells/uL (ref 200–950)
Basophils Absolute: 50 cells/uL (ref 0–200)
Basophils Relative: 0.5 %
Eosinophils Absolute: 89 cells/uL (ref 15–500)
Eosinophils Relative: 0.9 %
HCT: 46.6 % (ref 38.5–50.0)
Hemoglobin: 15.9 g/dL (ref 13.2–17.1)
Lymphs Abs: 1861 cells/uL (ref 850–3900)
MCH: 30 pg (ref 27.0–33.0)
MCHC: 34.1 g/dL (ref 32.0–36.0)
MCV: 87.9 fL (ref 80.0–100.0)
MPV: 10.7 fL (ref 7.5–12.5)
Monocytes Relative: 5.4 %
Neutro Abs: 7366 cells/uL (ref 1500–7800)
Neutrophils Relative %: 74.4 %
Platelets: 247 10*3/uL (ref 140–400)
RBC: 5.3 10*6/uL (ref 4.20–5.80)
RDW: 13.4 % (ref 11.0–15.0)
Total Lymphocyte: 18.8 %
WBC: 9.9 10*3/uL (ref 3.8–10.8)

## 2019-04-21 LAB — COMPLETE METABOLIC PANEL WITH GFR
AG Ratio: 1.5 (calc) (ref 1.0–2.5)
ALT: 26 U/L (ref 9–46)
AST: 27 U/L (ref 10–35)
Albumin: 4.8 g/dL (ref 3.6–5.1)
Alkaline phosphatase (APISO): 62 U/L (ref 35–144)
BUN: 13 mg/dL (ref 7–25)
CO2: 30 mmol/L (ref 20–32)
Calcium: 10.3 mg/dL (ref 8.6–10.3)
Chloride: 102 mmol/L (ref 98–110)
Creat: 0.87 mg/dL (ref 0.70–1.33)
GFR, Est African American: 114 mL/min/{1.73_m2} (ref >=60)
GFR, Est Non African American: 99 mL/min/{1.73_m2} (ref >=60)
Globulin: 3.2 g/dL (calc) (ref 1.9–3.7)
Glucose, Bld: 90 mg/dL (ref 65–99)
Potassium: 4.8 mmol/L (ref 3.5–5.3)
Sodium: 140 mmol/L (ref 135–146)
Total Bilirubin: 0.6 mg/dL (ref 0.2–1.2)
Total Protein: 8 g/dL (ref 6.1–8.1)

## 2019-04-21 LAB — HEPATITIS C GENOTYPE

## 2019-04-21 LAB — HCV RNA,QN PCR RFLX GENO, LIPABAD
HCV RNA, PCR, QN (Log): 7.39 log IU/mL — ABNORMAL HIGH
HCV RNA, PCR, QN: 24500000 IU/mL — ABNORMAL HIGH

## 2019-04-21 LAB — PROTIME-INR
INR: 1
Prothrombin Time: 10.9 s (ref 9.0–11.5)

## 2019-04-26 ENCOUNTER — Ambulatory Visit (HOSPITAL_COMMUNITY)
Admission: RE | Admit: 2019-04-26 | Discharge: 2019-04-26 | Disposition: A | Discharge status: Home / Self Care | Payer: Managed Care, Other (non HMO) | Source: Ambulatory Visit | Attending: Gastroenterology | Admitting: Gastroenterology

## 2019-04-26 ENCOUNTER — Other Ambulatory Visit: Payer: Self-pay

## 2019-04-26 DIAGNOSIS — B1920 Unspecified viral hepatitis C without hepatic coma: Secondary | ICD-10-CM | POA: Diagnosis present

## 2019-05-04 ENCOUNTER — Telehealth (INDEPENDENT_AMBULATORY_CARE_PROVIDER_SITE_OTHER): Payer: Self-pay | Admitting: *Deleted

## 2019-05-04 NOTE — Telephone Encounter (Signed)
Patient returned your call about lab results, please call [phone]

## 2019-05-07 NOTE — Telephone Encounter (Signed)
Attempted to call patient again.  No answer and voicemail box full.  Unable to leave message.  Tammy - have we heard back from insurance about getting Epclusa or Harvoni approved? Thanks

## 2019-05-07 NOTE — Telephone Encounter (Signed)
Request for PA will be sent today.

## 2019-05-15 ENCOUNTER — Telehealth (INDEPENDENT_AMBULATORY_CARE_PROVIDER_SITE_OTHER): Payer: Self-pay | Admitting: Gastroenterology

## 2019-05-15 NOTE — Telephone Encounter (Signed)
Spouses ph# [phone]

## 2019-05-15 NOTE — Telephone Encounter (Signed)
Spouse called stated patient has been playing phone tag regarding test results - stated he can not answer his phone while he is at work - stated to call her [name]) at [phone]

## 2019-05-16 NOTE — Telephone Encounter (Signed)
Tammy-I know we have submitted to [employer] for his hepatitis C medicine-I am not sure if we have heard back about approval yet?  Can call spouse with update when hear back what regimen will be approved.  I will be on maternity leave but can arrange his labs during treatment to be ordered under Dr. Laural Golden thanks.

## 2019-05-17 NOTE — Telephone Encounter (Signed)
[employer] has been called. I was told that the Pharmacist were busy and that they would have to call me back. This is to verify the patient's prescription.

## 2019-12-05 ENCOUNTER — Telehealth (INDEPENDENT_AMBULATORY_CARE_PROVIDER_SITE_OTHER): Payer: Self-pay | Admitting: Gastroenterology

## 2019-12-05 NOTE — Telephone Encounter (Signed)
Tammy-please call patient for mail patient a letter to call our office.  He never ended up getting his hepatitis C medicine prescribed back in February.  He will need to follow-up in office to discuss before submitting for treatment again.

## 2019-12-05 NOTE — Telephone Encounter (Signed)
Will address 12/06/2019.

## 2019-12-06 ENCOUNTER — Encounter (INDEPENDENT_AMBULATORY_CARE_PROVIDER_SITE_OTHER): Payer: Self-pay | Admitting: *Deleted

## 2019-12-06 NOTE — Telephone Encounter (Signed)
A letter has been sent asking the patient to call office for appointment with Laurine Blazer PA-C.

## 2020-01-24 ENCOUNTER — Encounter (INDEPENDENT_AMBULATORY_CARE_PROVIDER_SITE_OTHER): Payer: Self-pay | Admitting: Gastroenterology

## 2020-01-24 ENCOUNTER — Ambulatory Visit (INDEPENDENT_AMBULATORY_CARE_PROVIDER_SITE_OTHER): Payer: BC Managed Care – PPO | Admitting: Gastroenterology

## 2020-01-24 ENCOUNTER — Ambulatory Visit (INDEPENDENT_AMBULATORY_CARE_PROVIDER_SITE_OTHER): Payer: Managed Care, Other (non HMO) | Admitting: Gastroenterology

## 2020-01-24 ENCOUNTER — Other Ambulatory Visit: Payer: Self-pay

## 2020-01-24 ENCOUNTER — Other Ambulatory Visit (INDEPENDENT_AMBULATORY_CARE_PROVIDER_SITE_OTHER): Payer: Self-pay | Admitting: *Deleted

## 2020-01-24 ENCOUNTER — Telehealth (INDEPENDENT_AMBULATORY_CARE_PROVIDER_SITE_OTHER): Payer: Self-pay | Admitting: *Deleted

## 2020-01-24 ENCOUNTER — Encounter (INDEPENDENT_AMBULATORY_CARE_PROVIDER_SITE_OTHER): Payer: Self-pay | Admitting: *Deleted

## 2020-01-24 VITALS — BP 124/69 | HR 68 | Temp 98.4°F | Ht 68.5 in | Wt 130.8 lb

## 2020-01-24 DIAGNOSIS — B349 Viral infection, unspecified: Secondary | ICD-10-CM | POA: Diagnosis not present

## 2020-01-24 DIAGNOSIS — B182 Chronic viral hepatitis C: Secondary | ICD-10-CM

## 2020-01-24 LAB — CBC WITH DIFFERENTIAL/PLATELET
Monocytes Relative: 10.1 %
Platelets: 232 10*3/uL (ref 140–400)
RDW: 13.7 % (ref 11.0–15.0)

## 2020-01-24 MED ORDER — PLENVU 140 G PO SOLR: 1.0000 | Freq: Once | ORAL | 0 refills | Status: AC
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
## 2020-01-24 NOTE — Addendum Note (Signed)
Addended by: Laurine Blazer A on: 01/24/2020 10:47 AM   Modules accepted: Orders, SmartSet

## 2020-01-24 NOTE — Telephone Encounter (Signed)
Patient needs Plenvu (copay card) ° °

## 2020-01-24 NOTE — Patient Instructions (Signed)
We are checking labs today for evaluation.  We are also scheduling a screening colonoscopy.  Please call us with any questions.

## 2020-01-25 LAB — CBC WITH DIFFERENTIAL/PLATELET
HCT: 41.1 % (ref 38.5–50.0)
Lymphs Abs: 2309 cells/uL (ref 850–3900)
MCH: 31.6 pg (ref 27.0–33.0)
RBC: 4.49 10*6/uL (ref 4.20–5.80)

## 2020-01-25 LAB — COMPLETE METABOLIC PANEL WITH GFR: ALT: 13 U/L (ref 9–46)

## 2020-01-26 LAB — CBC WITH DIFFERENTIAL/PLATELET
Absolute Monocytes: 788 cells/uL (ref 200–950)
Basophils Absolute: 47 cells/uL (ref 0–200)
Basophils Relative: 0.6 %
Eosinophils Absolute: 179 cells/uL (ref 15–500)
Eosinophils Relative: 2.3 %
Hemoglobin: 14.2 g/dL (ref 13.2–17.1)
MCHC: 34.5 g/dL (ref 32.0–36.0)
MCV: 91.5 fL (ref 80.0–100.0)
MPV: 9.9 fL (ref 7.5–12.5)
Neutro Abs: 4477 cells/uL (ref 1500–7800)
Neutrophils Relative %: 57.4 %
Total Lymphocyte: 29.6 %
WBC: 7.8 10*3/uL (ref 3.8–10.8)

## 2020-01-26 LAB — HEPATITIS C RNA QUANTITATIVE
HCV RNA, PCR, QN (Log): <1.18 log IU/mL
HCV RNA, PCR, QN: <15 IU/mL

## 2020-01-26 LAB — COMPLETE METABOLIC PANEL WITH GFR
AG Ratio: 1.8 (calc) (ref 1.0–2.5)
AST: 12 U/L (ref 10–35)
Albumin: 4.2 g/dL (ref 3.6–5.1)
Alkaline phosphatase (APISO): 76 U/L (ref 35–144)
BUN: 17 mg/dL (ref 7–25)
CO2: 34 mmol/L — ABNORMAL HIGH (ref 20–32)
Calcium: 10 mg/dL (ref 8.6–10.3)
Chloride: 103 mmol/L (ref 98–110)
Creat: 0.8 mg/dL (ref 0.70–1.33)
GFR, Est African American: 117 mL/min/{1.73_m2} (ref >=60)
GFR, Est Non African American: 101 mL/min/{1.73_m2} (ref >=60)
Globulin: 2.4 g/dL (calc) (ref 1.9–3.7)
Glucose, Bld: 77 mg/dL (ref 65–139)
Potassium: 5.4 mmol/L — ABNORMAL HIGH (ref 3.5–5.3)
Sodium: 142 mmol/L (ref 135–146)
Total Bilirubin: 0.3 mg/dL (ref 0.2–1.2)
Total Protein: 6.6 g/dL (ref 6.1–8.1)

## 2020-02-20 ENCOUNTER — Other Ambulatory Visit (HOSPITAL_COMMUNITY)
Admission: RE | Admit: 2020-02-20 | Discharge: 2020-02-20 | Disposition: A | Discharge status: Home / Self Care | Payer: BC Managed Care – PPO | Source: Ambulatory Visit | Attending: Internal Medicine | Admitting: Internal Medicine

## 2020-02-20 NOTE — Progress Notes (Signed)
Called patient before I saw his procedure had been cancelled just not his Covid test. I got his voicemail but his mailbox was full, couldn't leave a message. I'll cancel covid appt. Nothing further needed.

## 2020-02-21 ENCOUNTER — Ambulatory Visit (HOSPITAL_COMMUNITY)
Admission: RE | Admit: 2020-02-21 | Payer: BC Managed Care – PPO | Source: Home / Self Care | Admitting: Internal Medicine

## 2020-02-21 ENCOUNTER — Encounter (HOSPITAL_COMMUNITY): Admission: RE | Payer: Self-pay | Source: Home / Self Care

## 2020-02-21 SURGERY — COLONOSCOPY
Anesthesia: Moderate Sedation

## 2020-03-28 DIAGNOSIS — J0101 Acute recurrent maxillary sinusitis: Secondary | ICD-10-CM | POA: Diagnosis not present

## 2020-03-28 DIAGNOSIS — Z20828 Contact with and (suspected) exposure to other viral communicable diseases: Secondary | ICD-10-CM | POA: Diagnosis not present

## 2020-08-07 DIAGNOSIS — M25521 Pain in right elbow: Secondary | ICD-10-CM | POA: Diagnosis not present

## 2020-08-20 DIAGNOSIS — M25521 Pain in right elbow: Secondary | ICD-10-CM | POA: Diagnosis not present

## 2020-08-20 DIAGNOSIS — M25531 Pain in right wrist: Secondary | ICD-10-CM | POA: Diagnosis not present

## 2020-08-20 DIAGNOSIS — M25631 Stiffness of right wrist, not elsewhere classified: Secondary | ICD-10-CM | POA: Diagnosis not present

## 2020-08-20 DIAGNOSIS — M25621 Stiffness of right elbow, not elsewhere classified: Secondary | ICD-10-CM | POA: Diagnosis not present

## 2020-09-03 DIAGNOSIS — M25631 Stiffness of right wrist, not elsewhere classified: Secondary | ICD-10-CM | POA: Diagnosis not present

## 2020-09-03 DIAGNOSIS — M25531 Pain in right wrist: Secondary | ICD-10-CM | POA: Diagnosis not present

## 2020-09-03 DIAGNOSIS — M25621 Stiffness of right elbow, not elsewhere classified: Secondary | ICD-10-CM | POA: Diagnosis not present

## 2020-09-03 DIAGNOSIS — M25521 Pain in right elbow: Secondary | ICD-10-CM | POA: Diagnosis not present

## 2020-09-18 DIAGNOSIS — S46311A Strain of muscle, fascia and tendon of triceps, right arm, initial encounter: Secondary | ICD-10-CM | POA: Diagnosis not present

## 2020-09-18 DIAGNOSIS — M25521 Pain in right elbow: Secondary | ICD-10-CM | POA: Diagnosis not present

## 2020-10-10 DIAGNOSIS — M25521 Pain in right elbow: Secondary | ICD-10-CM | POA: Diagnosis not present

## 2020-10-10 DIAGNOSIS — M7989 Other specified soft tissue disorders: Secondary | ICD-10-CM | POA: Diagnosis not present

## 2020-10-10 DIAGNOSIS — S46311A Strain of muscle, fascia and tendon of triceps, right arm, initial encounter: Secondary | ICD-10-CM | POA: Diagnosis not present

## 2020-10-10 DIAGNOSIS — X58XXXA Exposure to other specified factors, initial encounter: Secondary | ICD-10-CM | POA: Diagnosis not present

## 2020-10-13 DIAGNOSIS — M25521 Pain in right elbow: Secondary | ICD-10-CM | POA: Diagnosis not present

## 2020-10-28 DIAGNOSIS — M546 Pain in thoracic spine: Secondary | ICD-10-CM | POA: Diagnosis not present

## 2020-10-28 DIAGNOSIS — M542 Cervicalgia: Secondary | ICD-10-CM | POA: Diagnosis not present

## 2020-10-28 DIAGNOSIS — M5412 Radiculopathy, cervical region: Secondary | ICD-10-CM | POA: Diagnosis not present

## 2020-11-07 DIAGNOSIS — M542 Cervicalgia: Secondary | ICD-10-CM | POA: Diagnosis not present

## 2020-11-07 DIAGNOSIS — M25521 Pain in right elbow: Secondary | ICD-10-CM | POA: Diagnosis not present

## 2020-11-12 DIAGNOSIS — M5412 Radiculopathy, cervical region: Secondary | ICD-10-CM | POA: Diagnosis not present

## 2022-05-12 DIAGNOSIS — G8929 Other chronic pain: Secondary | ICD-10-CM | POA: Diagnosis not present

## 2022-05-12 DIAGNOSIS — G894 Chronic pain syndrome: Secondary | ICD-10-CM | POA: Diagnosis not present

## 2022-05-12 DIAGNOSIS — M545 Low back pain, unspecified: Secondary | ICD-10-CM | POA: Diagnosis not present

## 2022-05-12 DIAGNOSIS — M25512 Pain in left shoulder: Secondary | ICD-10-CM | POA: Diagnosis not present

## 2022-05-12 DIAGNOSIS — Z7689 Persons encountering health services in other specified circumstances: Secondary | ICD-10-CM | POA: Diagnosis not present

## 2022-05-28 DIAGNOSIS — Z125 Encounter for screening for malignant neoplasm of prostate: Secondary | ICD-10-CM | POA: Diagnosis not present

## 2022-05-28 DIAGNOSIS — G894 Chronic pain syndrome: Secondary | ICD-10-CM | POA: Diagnosis not present

## 2022-05-28 DIAGNOSIS — Z131 Encounter for screening for diabetes mellitus: Secondary | ICD-10-CM | POA: Diagnosis not present

## 2022-06-03 DIAGNOSIS — E785 Hyperlipidemia, unspecified: Secondary | ICD-10-CM | POA: Diagnosis not present

## 2022-06-03 DIAGNOSIS — H6122 Impacted cerumen, left ear: Secondary | ICD-10-CM | POA: Diagnosis not present

## 2022-06-03 DIAGNOSIS — Z125 Encounter for screening for malignant neoplasm of prostate: Secondary | ICD-10-CM | POA: Diagnosis not present

## 2022-06-03 DIAGNOSIS — G894 Chronic pain syndrome: Secondary | ICD-10-CM | POA: Diagnosis not present

## 2022-06-03 DIAGNOSIS — F17210 Nicotine dependence, cigarettes, uncomplicated: Secondary | ICD-10-CM | POA: Diagnosis not present

## 2022-06-03 DIAGNOSIS — R7303 Prediabetes: Secondary | ICD-10-CM | POA: Diagnosis not present

## 2022-06-03 DIAGNOSIS — M25512 Pain in left shoulder: Secondary | ICD-10-CM | POA: Diagnosis not present

## 2022-06-03 DIAGNOSIS — M545 Low back pain, unspecified: Secondary | ICD-10-CM | POA: Diagnosis not present

## 2022-06-03 DIAGNOSIS — Z0001 Encounter for general adult medical examination with abnormal findings: Secondary | ICD-10-CM | POA: Diagnosis not present

## 2022-06-13 DIAGNOSIS — Z1211 Encounter for screening for malignant neoplasm of colon: Secondary | ICD-10-CM | POA: Diagnosis not present

## 2022-12-15 DIAGNOSIS — M545 Low back pain, unspecified: Secondary | ICD-10-CM | POA: Diagnosis not present

## 2023-05-31 DIAGNOSIS — R7303 Prediabetes: Secondary | ICD-10-CM | POA: Diagnosis not present

## 2023-05-31 DIAGNOSIS — G894 Chronic pain syndrome: Secondary | ICD-10-CM | POA: Diagnosis not present

## 2023-05-31 DIAGNOSIS — Z125 Encounter for screening for malignant neoplasm of prostate: Secondary | ICD-10-CM | POA: Diagnosis not present

## 2023-06-07 DIAGNOSIS — G894 Chronic pain syndrome: Secondary | ICD-10-CM | POA: Diagnosis not present

## 2023-06-07 DIAGNOSIS — R7303 Prediabetes: Secondary | ICD-10-CM | POA: Diagnosis not present

## 2023-06-07 DIAGNOSIS — Z23 Encounter for immunization: Secondary | ICD-10-CM | POA: Diagnosis not present

## 2023-06-07 DIAGNOSIS — E785 Hyperlipidemia, unspecified: Secondary | ICD-10-CM | POA: Diagnosis not present

## 2023-06-07 DIAGNOSIS — F17200 Nicotine dependence, unspecified, uncomplicated: Secondary | ICD-10-CM | POA: Diagnosis not present

## 2023-06-07 DIAGNOSIS — M25512 Pain in left shoulder: Secondary | ICD-10-CM | POA: Diagnosis not present

## 2023-06-07 DIAGNOSIS — Z0001 Encounter for general adult medical examination with abnormal findings: Secondary | ICD-10-CM | POA: Diagnosis not present

## 2023-06-07 DIAGNOSIS — M545 Low back pain, unspecified: Secondary | ICD-10-CM | POA: Diagnosis not present

## 2023-06-07 DIAGNOSIS — Z125 Encounter for screening for malignant neoplasm of prostate: Secondary | ICD-10-CM | POA: Diagnosis not present

## 2024-01-10 DIAGNOSIS — J069 Acute upper respiratory infection, unspecified: Secondary | ICD-10-CM | POA: Diagnosis not present

## 2024-01-10 DIAGNOSIS — F17210 Nicotine dependence, cigarettes, uncomplicated: Secondary | ICD-10-CM | POA: Diagnosis not present
